# Patient Record
Sex: Male | Born: 1935 | Race: White | Hispanic: No | Marital: Single | Smoking: Never smoker
Health system: Southern US, Community
[De-identification: ages and names within clinical notes are randomized; demographics above are authoritative.]

## PROBLEM LIST (undated history)

## (undated) DIAGNOSIS — E7800 Pure hypercholesterolemia, unspecified: Secondary | ICD-10-CM

## (undated) DIAGNOSIS — I10 Essential (primary) hypertension: Secondary | ICD-10-CM

## (undated) DIAGNOSIS — E119 Type 2 diabetes mellitus without complications: Secondary | ICD-10-CM

---

## 2013-06-08 ENCOUNTER — Emergency Department (HOSPITAL_COMMUNITY)
Admission: EM | Admit: 2013-06-08 | Discharge: 2013-06-08 | Disposition: A | Discharge status: Home / Self Care | Payer: Medicare Other | Attending: Emergency Medicine | Admitting: Emergency Medicine

## 2013-06-08 ENCOUNTER — Encounter (HOSPITAL_COMMUNITY): Payer: Self-pay | Admitting: Family Medicine

## 2013-06-08 ENCOUNTER — Emergency Department (HOSPITAL_COMMUNITY): Payer: Medicare Other

## 2013-06-08 DIAGNOSIS — W1809XA Striking against other object with subsequent fall, initial encounter: Secondary | ICD-10-CM | POA: Insufficient documentation

## 2013-06-08 DIAGNOSIS — Z79899 Other long term (current) drug therapy: Secondary | ICD-10-CM | POA: Insufficient documentation

## 2013-06-08 DIAGNOSIS — S2231XA Fracture of one rib, right side, initial encounter for closed fracture: Secondary | ICD-10-CM

## 2013-06-08 DIAGNOSIS — Y9389 Activity, other specified: Secondary | ICD-10-CM | POA: Insufficient documentation

## 2013-06-08 DIAGNOSIS — I10 Essential (primary) hypertension: Secondary | ICD-10-CM | POA: Insufficient documentation

## 2013-06-08 DIAGNOSIS — E119 Type 2 diabetes mellitus without complications: Secondary | ICD-10-CM | POA: Insufficient documentation

## 2013-06-08 DIAGNOSIS — S2249XA Multiple fractures of ribs, unspecified side, initial encounter for closed fracture: Secondary | ICD-10-CM | POA: Insufficient documentation

## 2013-06-08 DIAGNOSIS — Y92009 Unspecified place in unspecified non-institutional (private) residence as the place of occurrence of the external cause: Secondary | ICD-10-CM | POA: Insufficient documentation

## 2013-06-08 DIAGNOSIS — E7800 Pure hypercholesterolemia, unspecified: Secondary | ICD-10-CM | POA: Insufficient documentation

## 2013-06-08 HISTORY — DX: Type 2 diabetes mellitus without complications: E11.9

## 2013-06-08 HISTORY — DX: Pure hypercholesterolemia, unspecified: E78.00

## 2013-06-08 HISTORY — DX: Essential (primary) hypertension: I10

## 2013-06-08 MED ORDER — OXYCODONE-ACETAMINOPHEN 5-325 MG PO TABS
1.0000 | ORAL_TABLET | Freq: Once | ORAL | Status: AC
  Administered 2013-06-08: 1 via ORAL
  Filled 2013-06-08: qty 1

## 2013-06-08 MED ORDER — HYDROMORPHONE HCL PF 1 MG/ML IJ SOLN: 1.0000 mg | Freq: Once | INTRAMUSCULAR | Status: DC

## 2013-06-08 MED ORDER — LISINOPRIL 20 MG PO TABS
20.0000 mg | ORAL_TABLET | Freq: Once | ORAL | Status: AC
  Administered 2013-06-08: 20 mg via ORAL
  Filled 2013-06-08: qty 1

## 2013-06-08 MED ORDER — METOPROLOL TARTRATE 25 MG PO TABS
50.0000 mg | ORAL_TABLET | Freq: Once | ORAL | Status: AC
  Administered 2013-06-08: 50 mg via ORAL
  Filled 2013-06-08: qty 2

## 2013-06-08 MED ORDER — OXYCODONE-ACETAMINOPHEN 5-325 MG PO TABS: 1.0000 | ORAL_TABLET | ORAL | Status: AC | PRN

## 2013-06-08 NOTE — ED Provider Notes (Signed)
History    CSN: [id number] Arrival date & time 06/08/13  1232  First MD Initiated Contact with Patient 06/08/13 1248     Chief Complaint  Patient presents with  . Fall   (Consider location/radiation/quality/duration/timing/severity/associated sxs/prior Treatment) HPI Anthony Rollins is a 77 y.o. male who presents to ED with complaint right rib pain after falling today at home. Pt states he was standing on the couch trying to change window blinds when he lost his balance and fell backward hitting right side of his chest on a coffee table. Sates he did not hit his head on the floor or on the table. Pt states he did not have LOC, no headache at this time. Denies neck pain, back pain, pain in the hips. States he is able to ambulate with difficulty. Did not take any medications prior to coming in. No shortness of breath. Denies feeling dizzy or light headed prior to coming in.  Past Medical History  Diagnosis Date  . Hypertension   . High cholesterol   . Diabetes mellitus without complication    History reviewed. No pertinent past surgical history. History reviewed. No pertinent family history. History  Substance Use Topics  . Smoking status: Never Smoker   . Smokeless tobacco: Not on file  . Alcohol Use: No    Review of Systems  Constitutional: Negative for fever and chills.  HENT: Negative for neck pain and neck stiffness.   Respiratory: Negative.   Cardiovascular: Negative.   Gastrointestinal: Negative.   Genitourinary: Negative for flank pain.  Musculoskeletal: Positive for arthralgias.  Skin: Positive for wound.  Neurological: Negative for dizziness, syncope, weakness, light-headedness, numbness and headaches.    Allergies  Codeine  Home Medications   Current Outpatient Rx  Name  Route  Sig  Dispense  Refill  . clopidogrel (PLAVIX) 75 MG tablet   Oral   Take 75 mg by mouth daily.         Marland Kitchen lisinopril (PRINIVIL,ZESTRIL) 20 MG tablet   Oral   Take 20 mg by mouth  daily.         . metFORMIN (GLUCOPHAGE) 1000 MG tablet   Oral   Take 1,000 mg by mouth 2 (two) times daily with a meal.         . metoprolol (LOPRESSOR) 50 MG tablet   Oral   Take 50 mg by mouth 2 (two) times daily.         . pioglitazone (ACTOS) 15 MG tablet   Oral   Take 15 mg by mouth daily.         . pravastatin (PRAVACHOL) 40 MG tablet   Oral   Take 40 mg by mouth daily.          BP 196/98  Pulse 67  Temp(Src) 98.3 F (36.8 C)  Resp 18  SpO2 98% Physical Exam  Nursing note and vitals reviewed. Constitutional: He appears well-developed and well-nourished. No distress.  Eyes: Conjunctivae are normal.  Neck: Neck supple.  Cardiovascular: Normal rate, regular rhythm and normal heart sounds.   Pulmonary/Chest: Effort normal and breath sounds normal. No respiratory distress. He has no wheezes. He has no rales.  Musculoskeletal: He exhibits no edema.  Abrasion to the right mid back, hemostatic. Tender to palpation over midaxillary lower ribs. No cervical, thoracic, lumbar midline spine tenderness.   Neurological: He is alert. No cranial nerve deficit. Coordination normal.  5/5 and equal upper and lower extremity strength bilaterally. Equal grip strength bilaterally.   Skin:  Skin is warm.    ED Course  Procedures (including critical care time) Labs Reviewed - No data to display Dg Ribs Unilateral W/chest Right  06/08/2013   *RADIOLOGY REPORT*  Clinical Data: Fall, mid to lower right back pain  RIGHT RIBS AND CHEST - 3+ VIEW  Comparison: None.  Findings: Mild bibasilar atelectasis.  Negative for focal consolidation, pleural effusion, pneumothorax or pulmonary edema. Cardiac and mediastinal contours within normal limits.  The thoracic aorta is tortuous atherosclerotic plate.  Acute minimally displaced fractures of the posterolateral aspect of right ribs nine, 10 and 11.  IMPRESSION:  1.  Acute mildly displaced fractures of the posterolateral aspects of ribs nine, 10 and  11. 2.  Mild bibasilar atelectasis.  Otherwise, no acute cardiopulmonary disease. 3.  Tortuous atherosclerotic thoracic aorta.   Original Report Authenticated By: Malachy Moan, M.D.    1. Rib fractures, right, closed, initial encounter   2. Hypertension     MDM  Pt with right rib pain after falling and hitting his ribs on a coffee table. He has no other injuries. He is ambulatory. No abdominal pain or tenderness, doubt intra abdominal injury. No shortness of breath.  BP elevated. Pt has not had his BP medications today yet. I have ordered them for him. Will recheck BP.   Rib film showed fractures of ribs 10 and 11. Pt feels better after percocet. Will d/c home with ibuprofen, percocet for pain, incentive spirometer, follow up with PCP.   Filed Vitals:   06/08/13 1242 06/08/13 1400 06/08/13 1519  BP: 196/98 191/81 176/82  Pulse: 67 60 68  Temp: 98.3 F (36.8 C)    Resp: 18 18 18   SpO2: 98% 95% 98%     Ricketta Colantonio A Leotha Voeltz, PA-C 06/08/13 1525

## 2013-06-08 NOTE — ED Notes (Signed)
Per pt had a fall this am. Was standing on the couch changing blinds and fell backward and hit corner of night stand. Pt hypertensive at triage but didn't take medication yet this am. Denies hitting head or LOC

## 2013-06-08 NOTE — ED Notes (Signed)
R.T in with I.S.

## 2013-06-08 NOTE — ED Notes (Signed)
Patient given sandwich to eat before he takes his bp medications per PA request.

## 2013-06-09 NOTE — ED Provider Notes (Signed)
Medical screening examination/treatment/procedure(s) were conducted as a shared visit with non-physician practitioner(s) and myself.  I personally evaluated the patient during the encounter 77 yo man fell from a couch and injured the right posterior chest wall.  Exam shows a contusion and abrasion on the right posterior chest.  X-rays of the right ribs showed minimally displaced fractures of the right ninth, tenth, and eleveth ribs, no pneumonthorax.  Advised rest, narcotic analgesics.  Return for shortness of breath, hemoptysis.  Carleene Cooper III, MD 06/09/13 [phone]

## 2013-06-11 ENCOUNTER — Emergency Department (HOSPITAL_COMMUNITY): Payer: Medicare Other

## 2013-06-11 ENCOUNTER — Encounter (HOSPITAL_COMMUNITY): Payer: Self-pay | Admitting: Emergency Medicine

## 2013-06-11 ENCOUNTER — Emergency Department (HOSPITAL_COMMUNITY)
Admission: EM | Admit: 2013-06-11 | Discharge: 2013-06-11 | Disposition: A | Discharge status: Home / Self Care | Payer: Medicare Other | Attending: Emergency Medicine | Admitting: Emergency Medicine

## 2013-06-11 DIAGNOSIS — E119 Type 2 diabetes mellitus without complications: Secondary | ICD-10-CM | POA: Insufficient documentation

## 2013-06-11 DIAGNOSIS — S2249XA Multiple fractures of ribs, unspecified side, initial encounter for closed fracture: Secondary | ICD-10-CM | POA: Insufficient documentation

## 2013-06-11 DIAGNOSIS — Y92009 Unspecified place in unspecified non-institutional (private) residence as the place of occurrence of the external cause: Secondary | ICD-10-CM | POA: Insufficient documentation

## 2013-06-11 DIAGNOSIS — Z79899 Other long term (current) drug therapy: Secondary | ICD-10-CM | POA: Insufficient documentation

## 2013-06-11 DIAGNOSIS — E7800 Pure hypercholesterolemia, unspecified: Secondary | ICD-10-CM | POA: Insufficient documentation

## 2013-06-11 DIAGNOSIS — Y9389 Activity, other specified: Secondary | ICD-10-CM | POA: Insufficient documentation

## 2013-06-11 DIAGNOSIS — Z7902 Long term (current) use of antithrombotics/antiplatelets: Secondary | ICD-10-CM | POA: Insufficient documentation

## 2013-06-11 DIAGNOSIS — W010XXA Fall on same level from slipping, tripping and stumbling without subsequent striking against object, initial encounter: Secondary | ICD-10-CM | POA: Insufficient documentation

## 2013-06-11 DIAGNOSIS — R109 Unspecified abdominal pain: Secondary | ICD-10-CM | POA: Insufficient documentation

## 2013-06-11 DIAGNOSIS — I10 Essential (primary) hypertension: Secondary | ICD-10-CM | POA: Insufficient documentation

## 2013-06-11 DIAGNOSIS — S2231XS Fracture of one rib, right side, sequela: Secondary | ICD-10-CM

## 2013-06-11 LAB — COMPREHENSIVE METABOLIC PANEL
Albumin: 3.8 g/dL (ref 3.5–5.2)
BUN: 23 mg/dL (ref 6–23)
Calcium: 9.6 mg/dL (ref 8.4–10.5)
Creatinine, Ser: 0.84 mg/dL (ref 0.50–1.35)
Potassium: 4.1 mEq/L (ref 3.5–5.1)
Total Protein: 6.9 g/dL (ref 6.0–8.3)

## 2013-06-11 LAB — CBC WITH DIFFERENTIAL/PLATELET
Basophils Relative: 0 % (ref 0–1)
Eosinophils Absolute: 0.1 10*3/uL (ref 0.0–0.7)
Eosinophils Relative: 1 % (ref 0–5)
Hemoglobin: 15.5 g/dL (ref 13.0–17.0)
MCH: 29.9 pg (ref 26.0–34.0)
MCHC: 35.1 g/dL (ref 30.0–36.0)
Monocytes Relative: 12 % (ref 3–12)
Neutrophils Relative %: 75 % (ref 43–77)

## 2013-06-11 LAB — URINALYSIS, ROUTINE W REFLEX MICROSCOPIC
Bilirubin Urine: NEGATIVE
Ketones, ur: 40 mg/dL — AB
Nitrite: NEGATIVE
Urobilinogen, UA: 1 mg/dL (ref 0.0–1.0)
pH: 7 (ref 5.0–8.0)

## 2013-06-11 LAB — URINE MICROSCOPIC-ADD ON

## 2013-06-11 MED ORDER — IOHEXOL 300 MG/ML  SOLN
100.0000 mL | Freq: Once | INTRAMUSCULAR | Status: AC | PRN
  Administered 2013-06-11: 100 mL via INTRAVENOUS

## 2013-06-11 MED ORDER — MORPHINE SULFATE 4 MG/ML IJ SOLN
4.0000 mg | Freq: Once | INTRAMUSCULAR | Status: AC
  Administered 2013-06-11: 4 mg via INTRAVENOUS
  Filled 2013-06-11: qty 1

## 2013-06-11 MED ORDER — OXYCODONE HCL 5 MG PO TABS: ORAL_TABLET | ORAL | Status: AC

## 2013-06-11 MED ORDER — SODIUM CHLORIDE 0.9 % IV BOLUS (SEPSIS)
1000.0000 mL | Freq: Once | INTRAVENOUS | Status: AC
  Administered 2013-06-11: 1000 mL via INTRAVENOUS

## 2013-06-11 MED ORDER — METHOCARBAMOL 750 MG PO TABS: 750.0000 mg | ORAL_TABLET | Freq: Four times a day (QID) | ORAL | Status: AC

## 2013-06-11 MED ORDER — ONDANSETRON HCL 4 MG/2ML IJ SOLN
4.0000 mg | Freq: Once | INTRAMUSCULAR | Status: AC
  Administered 2013-06-11: 4 mg via INTRAVENOUS
  Filled 2013-06-11: qty 2

## 2013-06-11 NOTE — ED Provider Notes (Signed)
History    CSN: [id number] Arrival date & time 06/11/13  1119  First MD Initiated Contact with Patient 06/11/13 1135     Chief Complaint  Patient presents with  . Pain   (Consider location/radiation/quality/duration/timing/severity/associated sxs/prior Treatment) HPI Patient reports he be seen here on July 9. He states he was at home and he lost his balance and fell. When he fell he hit the corner of a table on his right posterior chest and then fell to the floor on his back. He had right-sided chest pain. He had x-rays showing some displaced rib fractures. He reports however since he left the emergency department he now has shortness of breath on talking and dyspnea on exertion. He denies any cough or fever. He states his pain is worsening and now it is radiating around into his right upper quadrant and he describes it as "labor" pains. He also has had no bowel movement since she had the ED visit on the ninth. He states he normally has 3 bowel movements a day. He denies any hematuria. His friend states he cried all night b/o pain.    PCP Dr Sydnee Cabal in [place]  Past Medical History  Diagnosis Date  . Hypertension   . High cholesterol   . Diabetes mellitus without complication    History reviewed. No pertinent past surgical history. History reviewed. No pertinent family history. History  Substance Use Topics  . Smoking status: Never Smoker   . Smokeless tobacco: Not on file  . Alcohol Use: No  lives with sister Uses a cane sometimes  Review of Systems  All other systems reviewed and are negative.    Allergies  Codeine  Home Medications   Current Outpatient Rx  Name  Route  Sig  Dispense  Refill  . clopidogrel (PLAVIX) 75 MG tablet   Oral   Take 75 mg by mouth daily.         Marland Kitchen lisinopril (PRINIVIL,ZESTRIL) 20 MG tablet   Oral   Take 20 mg by mouth daily.         . metFORMIN (GLUCOPHAGE) 1000 MG tablet   Oral   Take 1,000 mg by mouth 2 (two) times daily  with a meal.         . metoprolol (LOPRESSOR) 50 MG tablet   Oral   Take 50 mg by mouth 2 (two) times daily.         Marland Kitchen oxyCODONE-acetaminophen (PERCOCET) 5-325 MG per tablet   Oral   Take 1 tablet by mouth every 4 (four) hours as needed for pain.   20 tablet   0   . pioglitazone (ACTOS) 15 MG tablet   Oral   Take 15 mg by mouth daily.         . pravastatin (PRAVACHOL) 40 MG tablet   Oral   Take 40 mg by mouth daily.          BP 187/98  Pulse 74  Temp(Src) 98.1 F (36.7 C) (Oral)  Resp 20  SpO2 98%  Vital signs normal except for hypertension  Physical Exam  Nursing note and vitals reviewed. Constitutional: He is oriented to person, place, and time. He appears well-developed and well-nourished.  Non-toxic appearance. He does not appear ill. No distress.  HENT:  Head: Normocephalic and atraumatic.  Right Ear: External ear normal.  Left Ear: External ear normal.  Nose: Nose normal. No mucosal edema or rhinorrhea.  Mouth/Throat: Oropharynx is clear and moist and mucous membranes are  normal. No dental abscesses or edematous.  Eyes: Conjunctivae and EOM are normal. Pupils are equal, round, and reactive to light.  Neck: Normal range of motion and full passive range of motion without pain. Neck supple.  Cardiovascular: Normal rate, regular rhythm and normal heart sounds.  Exam reveals no gallop and no friction rub.   No murmur heard. Pulmonary/Chest: Effort normal. No respiratory distress. He has decreased breath sounds. He has no wheezes. He has no rhonchi. He has no rales. He exhibits tenderness. He exhibits no crepitus.  Patient has diminished breath sounds diffusely because of his inability to breathe deeply because of pain  Abdominal: Soft. Normal appearance and bowel sounds are normal. He exhibits no distension. There is tenderness. There is no rebound and no guarding.  Musculoskeletal: Normal range of motion. He exhibits no edema and no tenderness.        Back:  Moves all extremities well.  Patient has bruising in his right lower rib cage and right flank area with some superficial epidermal abrasions. He's very tender to palpation in that area he is also tender to palpation in the right upper quadrant.  Neurological: He is alert and oriented to person, place, and time. He has normal strength. No cranial nerve deficit.  Skin: Skin is warm, dry and intact. No rash noted. No erythema. No pallor.  Psychiatric: He has a normal mood and affect. His speech is normal and behavior is normal. His mood appears not anxious.    ED Course  Procedures (including critical care time)  Medications  morphine 4 MG/ML injection 4 mg (4 mg Intravenous Given 06/11/13 1222)  ondansetron (ZOFRAN) injection 4 mg (4 mg Intravenous Given 06/11/13 1221)  sodium chloride 0.9 % bolus 1,000 mL (0 mLs Intravenous Stopped 06/11/13 1316)  iohexol (OMNIPAQUE) 300 MG/ML solution 100 mL (100 mLs Intravenous Contrast Given 06/11/13 1345)     15:01 Dr Dwain Sarna, trauma surgeon states just pain control, ambulate, deep breathing.  Pt given results of his tests. He is only taking 1 percocet every 6 hrs for pain. He also is not using ice packs. He describes sitting still then getting sharp radiating pain so will start on muscle relaxer also for muscle spasms.   Results for orders placed during the hospital encounter of 06/11/13  URINALYSIS, ROUTINE W REFLEX MICROSCOPIC      Result Value Range   Color, Urine YELLOW  YELLOW   APPearance CLEAR  CLEAR   Specific Gravity, Urine 1.018  1.005 - 1.030   pH 7.0  5.0 - 8.0   Glucose, UA NEGATIVE  NEGATIVE mg/dL   Hgb urine dipstick NEGATIVE  NEGATIVE   Bilirubin Urine NEGATIVE  NEGATIVE   Ketones, ur 40 (*) NEGATIVE mg/dL   Protein, ur 30 (*) NEGATIVE mg/dL   Urobilinogen, UA 1.0  0.0 - 1.0 mg/dL   Nitrite NEGATIVE  NEGATIVE   Leukocytes, UA NEGATIVE  NEGATIVE  CBC WITH DIFFERENTIAL      Result Value Range   WBC 8.4  4.0 - 10.5 K/uL    RBC 5.19  4.22 - 5.81 MIL/uL   Hemoglobin 15.5  13.0 - 17.0 g/dL   HCT 16.1  09.6 - 04.5 %   MCV 85.0  78.0 - 100.0 fL   MCH 29.9  26.0 - 34.0 pg   MCHC 35.1  30.0 - 36.0 g/dL   RDW 40.9  81.1 - 91.4 %   Platelets 195  150 - 400 K/uL   Neutrophils Relative % 75  43 - 77 %   Neutro Abs 6.2  1.7 - 7.7 K/uL   Lymphocytes Relative 12  12 - 46 %   Lymphs Abs 1.0  0.7 - 4.0 K/uL   Monocytes Relative 12  3 - 12 %   Monocytes Absolute 1.0  0.1 - 1.0 K/uL   Eosinophils Relative 1  0 - 5 %   Eosinophils Absolute 0.1  0.0 - 0.7 K/uL   Basophils Relative 0  0 - 1 %   Basophils Absolute 0.0  0.0 - 0.1 K/uL  COMPREHENSIVE METABOLIC PANEL      Result Value Range   Sodium 134 (*) 135 - 145 mEq/L   Potassium 4.1  3.5 - 5.1 mEq/L   Chloride 96  96 - 112 mEq/L   CO2 27  19 - 32 mEq/L   Glucose, Bld 137 (*) 70 - 99 mg/dL   BUN 23  6 - 23 mg/dL   Creatinine, Ser 2.53  0.50 - 1.35 mg/dL   Calcium 9.6  8.4 - 66.4 mg/dL   Total Protein 6.9  6.0 - 8.3 g/dL   Albumin 3.8  3.5 - 5.2 g/dL   AST 14  0 - 37 U/L   ALT 15  0 - 53 U/L   Alkaline Phosphatase 55  39 - 117 U/L   Total Bilirubin 1.0  0.3 - 1.2 mg/dL   GFR calc non Af Amer 84 (*) >90 mL/min   GFR calc Af Amer >90  >90 mL/min  URINE MICROSCOPIC-ADD ON      Result Value Range   Squamous Epithelial / LPF RARE  RARE   Laboratory interpretation all normal except mild hyponatremia   Ct Chest W Contrast  Ct Abdomen Pelvis W Contrast  06/11/2013   *RADIOLOGY REPORT*  Clinical Data:  Fall 3 days ago with known fractures of the right ninth, tenth and eleventh ribs.  There is a right chest pain, shortness of breath, right-sided flank pain and abdominal pain.  CT CHEST, ABDOMEN AND PELVIS WITH CONTRAST  Technique:  Multidetector CT imaging of the chest, abdomen and pelvis was performed following the standard protocol during bolus administration of intravenous contrast.  Contrast: OMNIPAQUE IOHEXOL 300 MG/ML  SOLN  Comparison:  Rib films on  06/08/2013  CT CHEST  Findings:  Minimally displaced fractures of the right ninth, tenth and eleventh ribs are demonstrated by CT.  There also is a probable nondisplaced fracture involving the very medial aspect of the 12th rib near the costovertebral junction.  There is no associated pneumothorax.  A trace amount of right pleural fluid is present as well as atelectasis involving the right lower lobe.  Atelectasis present at both lung bases.  No evidence of pericardial fluid.  The heart size is normal.  There appears to be a stent in the LAD.  The thoracic aorta is unremarkable. No masses or enlarged lymph nodes are seen.  IMPRESSION: Mildly displaced right ninth, tenth and eleventh rib fractures and a nondisplaced medial 12th rib fracture without evidence of pneumothorax or pulmonary contusion.  A trace amount of right pleural fluid is present.  CT ABDOMEN AND PELVIS  Findings:  The liver, gallbladder, pancreas, spleen, adrenal glands and kidneys are within normal limits.  No solid organ injuries identified.  There is no evidence of free fluid or abnormal fluid collection.  Bowel loops show normal caliber and no evidence of perforation or inflammation.  No masses or enlarged lymph nodes are seen.  There is no  evidence of hernia.  The bladder is unremarkable.  No fracture or hematoma is identified.  IMPRESSION: No acute injuries identified in the abdomen or pelvis.   Original Report Authenticated By: Irish Lack, M.D.     Dg Ribs Unilateral W/chest Right  06/08/2013   *RADIOLOGY REPORT*  Clinical Data: Fall, mid to lower right back pain  RIGHT RIBS AND CHEST - 3+ VIEW  Comparison: None.  Findings: Mild bibasilar atelectasis.  Negative for focal consolidation, pleural effusion, pneumothorax or pulmonary edema. Cardiac and mediastinal contours within normal limits.  The thoracic aorta is tortuous atherosclerotic plate.  Acute minimally displaced fractures of the posterolateral aspect of right ribs nine, 10 and  11.  IMPRESSION:  1.  Acute mildly displaced fractures of the posterolateral aspects of ribs nine, 10 and 11. 2.  Mild bibasilar atelectasis.  Otherwise, no acute cardiopulmonary disease. 3.  Tortuous atherosclerotic thoracic aorta.   Original Report Authenticated By: Malachy Moan, M.D.    1. Rib fractures, right, sequela    New Prescriptions   METHOCARBAMOL (ROBAXIN) 750 MG TABLET    Take 1 tablet (750 mg total) by mouth 4 (four) times daily.   OXYCODONE (ROXICODONE) 5 MG IMMEDIATE RELEASE TABLET    Take 1 or 2 po Q 4hrs for pain     Plan discharge   Devoria Albe, MD, FACEP     MDM    Ward Givens, MD 06/11/13 [phone]

## 2013-06-11 NOTE — ED Notes (Signed)
Pt ambulated to restroom with no issue. 

## 2013-06-11 NOTE — ED Notes (Signed)
Pt aware of need for urine sample.  

## 2013-06-11 NOTE — ED Notes (Addendum)
Pt c/o pain to his right lower back, near flank area, sts he was here on Wednesday and told he had 3 broken ribs, given percocet prescription and has been taking it at home but the pain isn't getting better. Pt sts that he has a high pain tolerance and normally doesn't even need to take pain meds but he has been taking them and doesn't even feel like he has taken anything. Pt sts that sometimes while he is talking he gets slightly sob. Pt denies urinary symptoms/issues. Pt in nad, skin warm and dry, resp e/u.

## 2013-06-11 NOTE — ED Notes (Signed)
Patient presents to ED today with complaints of right rib pain worse since Wednesday. Patient states he fell Wednesday and broke 3 ribs, was seen here and discharged but pain is much worse today.

## 2015-02-27 IMAGING — CR DG RIBS W/ CHEST 3+V*R*
4 series · 4 of 4 positions shown · non-contrast
Comparison: None.

CLINICAL DATA: Fall, mid to lower right back pain

RIGHT RIBS AND CHEST - 3+ VIEW

[w chest pa]
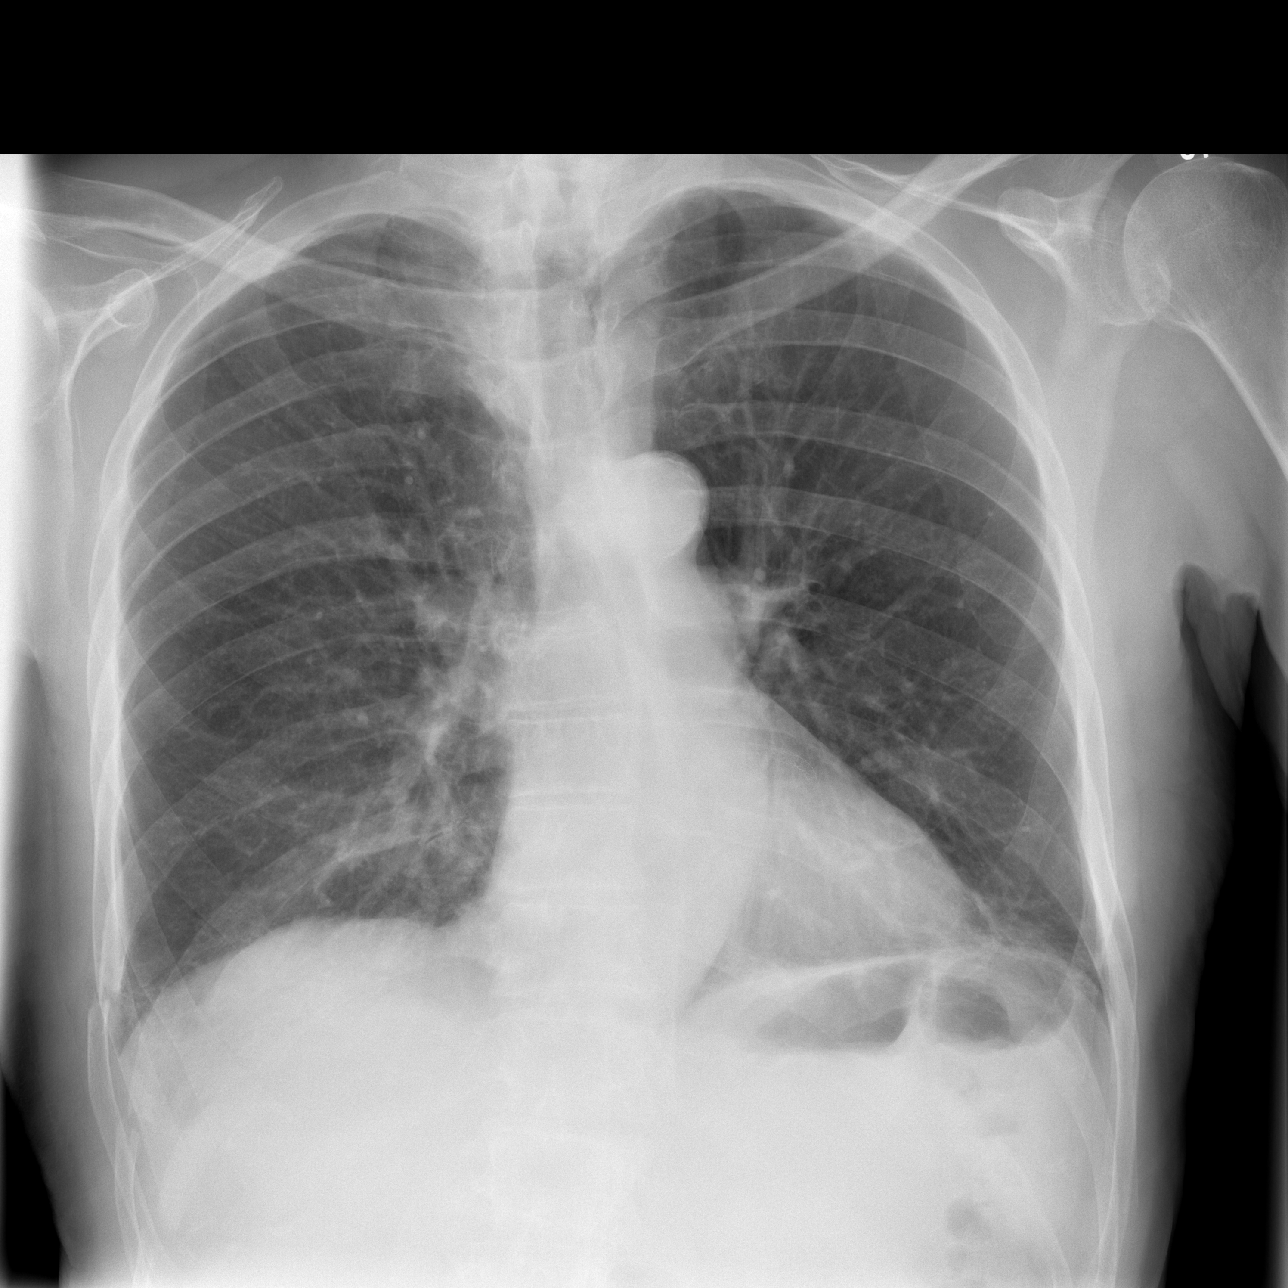

[w ribs ap/pa upper right]
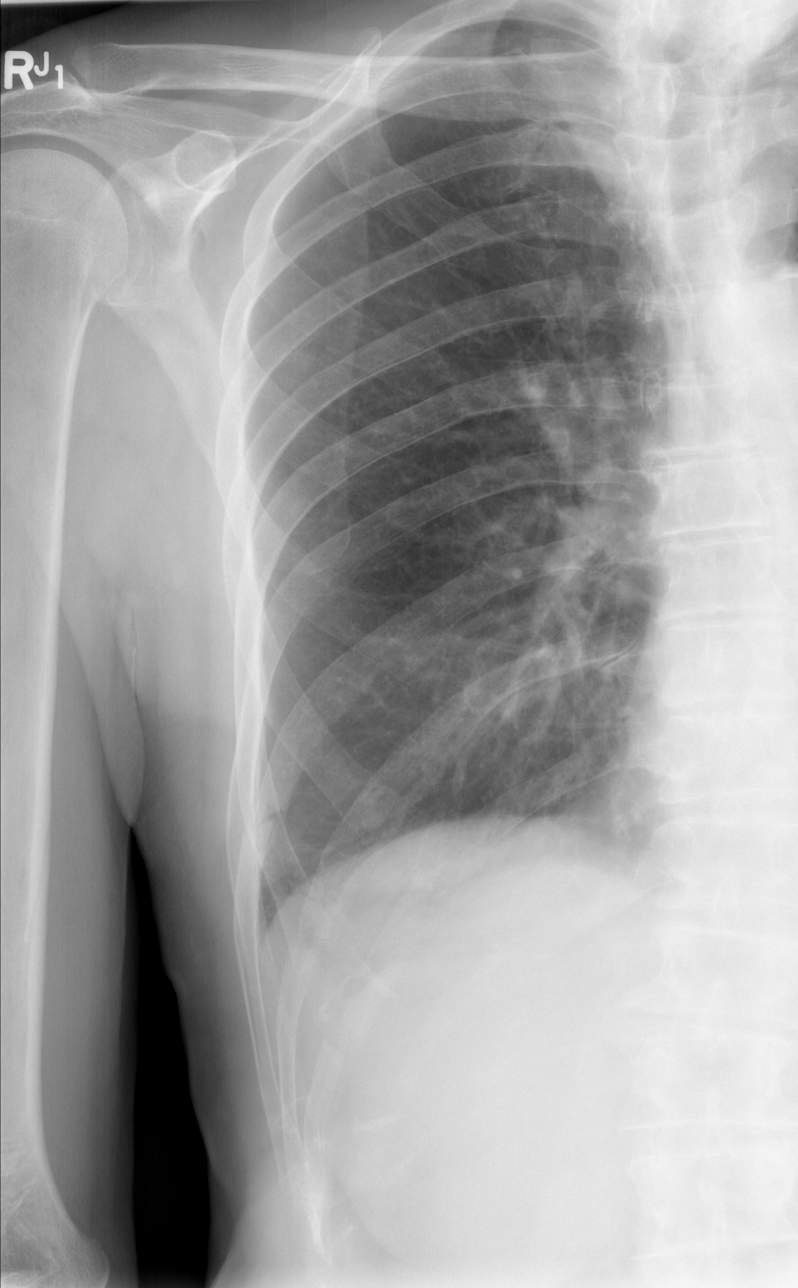

[w ribs oblique right (1 of 2)]
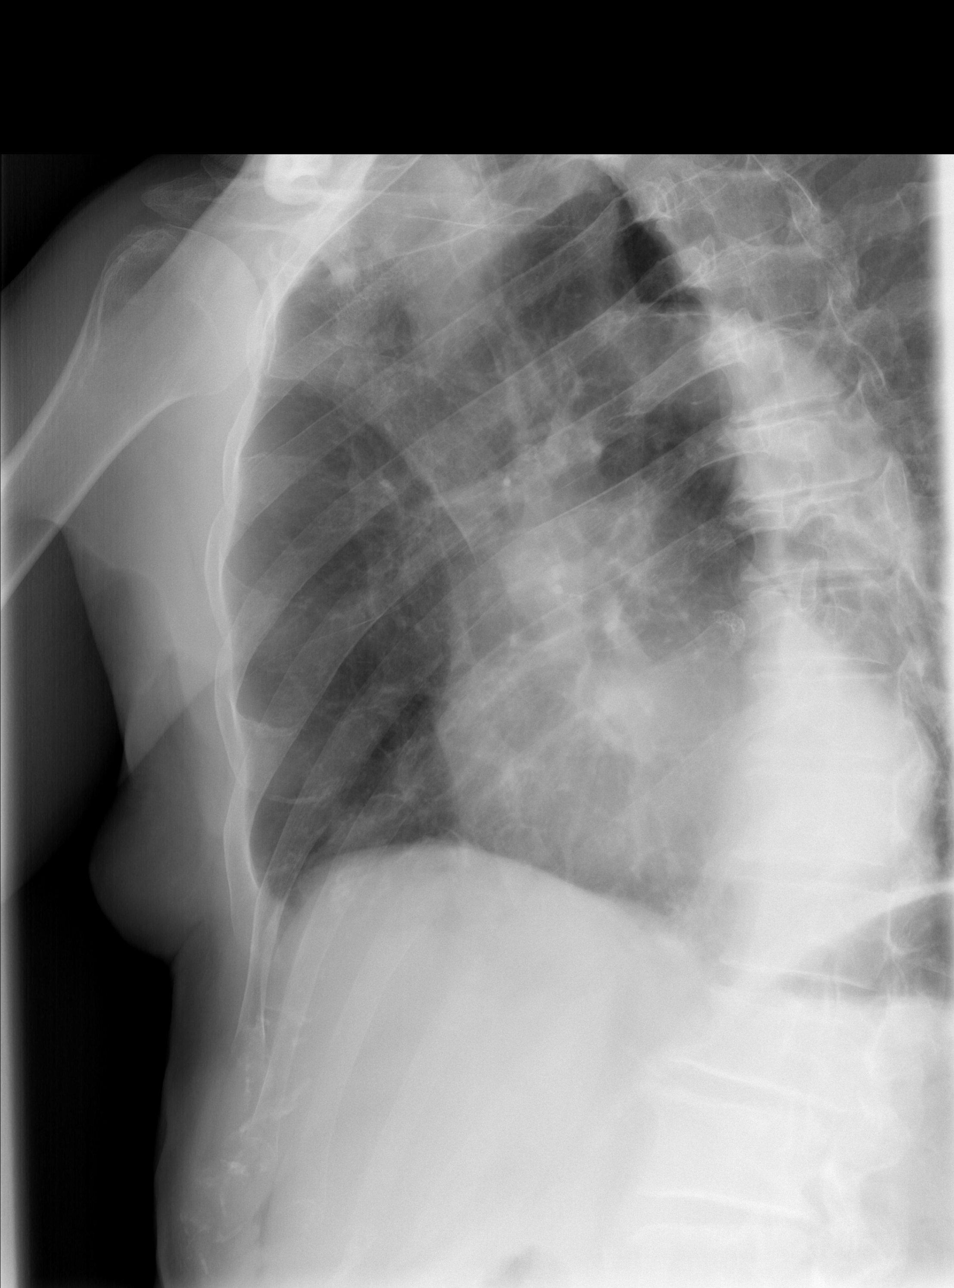

[w ribs oblique right (2 of 2)]
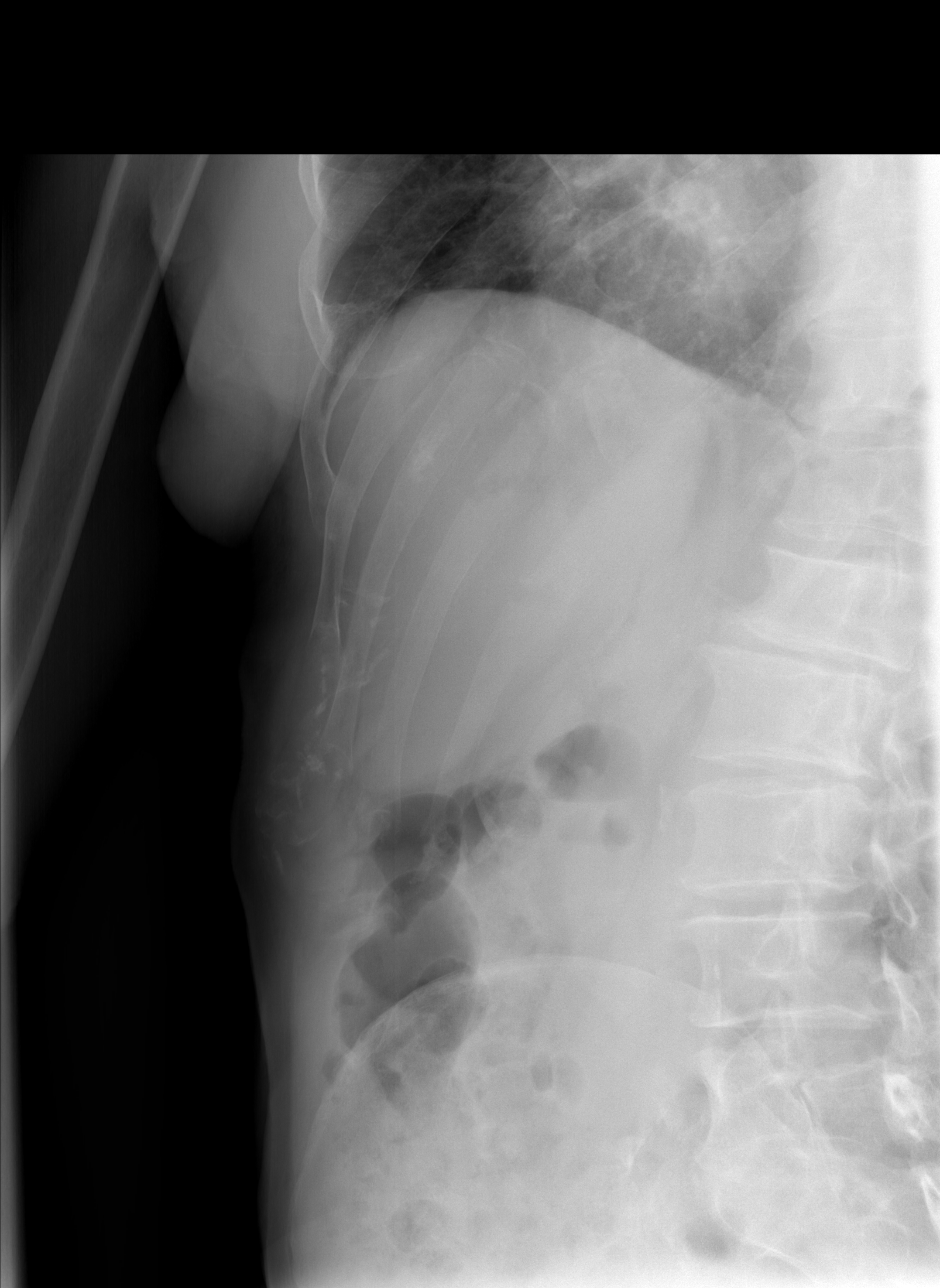

[4 of 4 positions shown; findings below may reference images not displayed]

FINDINGS: Mild bibasilar atelectasis.  Negative for focal
consolidation, pleural effusion, pneumothorax or pulmonary edema.
Cardiac and mediastinal contours within normal limits.  The
thoracic aorta is tortuous atherosclerotic plate.  Acute minimally
displaced fractures of the posterolateral aspect of right ribs
nine, 10 and 11.
IMPRESSION: 1.  Acute mildly displaced fractures of the posterolateral aspects
of ribs nine, 10 and 11.
2.  Mild bibasilar atelectasis.  Otherwise, no acute
cardiopulmonary disease.
3.  Tortuous atherosclerotic thoracic aorta.
